# Patient Record
Sex: Male | Born: 1980 | Race: Black or African American | Hispanic: No | Marital: Married | Smoking: Current every day smoker
Health system: Southern US, Community
[De-identification: ages and names within clinical notes are randomized; demographics above are authoritative.]

## PROBLEM LIST (undated history)

## (undated) DIAGNOSIS — K56609 Unspecified intestinal obstruction, unspecified as to partial versus complete obstruction: Secondary | ICD-10-CM

## (undated) DIAGNOSIS — W3400XA Accidental discharge from unspecified firearms or gun, initial encounter: Secondary | ICD-10-CM

## (undated) DIAGNOSIS — I10 Essential (primary) hypertension: Secondary | ICD-10-CM

## (undated) HISTORY — PX: SMALL INTESTINE SURGERY: SHX150

## (undated) HISTORY — PX: ABDOMINAL SURGERY: SHX537

---

## 2015-04-06 ENCOUNTER — Emergency Department (HOSPITAL_BASED_OUTPATIENT_CLINIC_OR_DEPARTMENT_OTHER)
Admission: EM | Admit: 2015-04-06 | Discharge: 2015-04-06 | Disposition: A | Discharge status: Home / Self Care | Payer: Medicare Other | Attending: Emergency Medicine | Admitting: Emergency Medicine

## 2015-04-06 ENCOUNTER — Encounter (HOSPITAL_BASED_OUTPATIENT_CLINIC_OR_DEPARTMENT_OTHER): Payer: Self-pay

## 2015-04-06 DIAGNOSIS — L0501 Pilonidal cyst with abscess: Secondary | ICD-10-CM | POA: Diagnosis not present

## 2015-04-06 DIAGNOSIS — Z880 Allergy status to penicillin: Secondary | ICD-10-CM | POA: Diagnosis not present

## 2015-04-06 DIAGNOSIS — Z720 Tobacco use: Secondary | ICD-10-CM | POA: Insufficient documentation

## 2015-04-06 DIAGNOSIS — Z8719 Personal history of other diseases of the digestive system: Secondary | ICD-10-CM | POA: Diagnosis not present

## 2015-04-06 DIAGNOSIS — L729 Follicular cyst of the skin and subcutaneous tissue, unspecified: Secondary | ICD-10-CM | POA: Diagnosis present

## 2015-04-06 HISTORY — DX: Unspecified intestinal obstruction, unspecified as to partial versus complete obstruction: K56.609

## 2015-04-06 MED ORDER — LIDOCAINE-EPINEPHRINE 2 %-1:100000 IJ SOLN
20.0000 mL | Freq: Once | INTRAMUSCULAR | Status: AC
  Administered 2015-04-06: 20 mL via INTRADERMAL
  Filled 2015-04-06: qty 1

## 2015-04-06 MED ORDER — HYDROCODONE-ACETAMINOPHEN 5-325 MG PO TABS
2.0000 | ORAL_TABLET | Freq: Once | ORAL | Status: AC
  Administered 2015-04-06: 2 via ORAL
  Filled 2015-04-06: qty 2

## 2015-04-06 NOTE — ED Notes (Signed)
MD at bedside. 

## 2015-04-06 NOTE — Discharge Instructions (Signed)
°  Pilonidal Cyst, Care After °A pilonidal cyst occurs when hairs get trapped (ingrown) beneath the skin in the crease between the buttocks over your sacrum (the bone under that crease). Pilonidal cysts are most common in young men with a lot of body hair. When the cyst breaks(ruptured) or leaks, fluid from the cyst may cause burning and itching. If the cyst becomes infected, it causes a painful swelling filled with pus (abscess). The pus and trapped hairs need to be removed (often by lancing) so that the infection can heal. The word pilonidal means hair nest. °HOME CARE INSTRUCTIONS °If the pilonidal sinus was NOT DRAINING OR LANCED: °· Keep the area clean and dry. Bathe or shower daily. Wash the area well with a germ-killing soap. Hot tub baths may help prevent infection. Dry the area well with a towel. °· Avoid tight clothing in order to keep area as moisture-free as possible. °· Keep area between buttocks as free from hair as possible. A depilatory may be used. °· Take antibiotics as directed. °· Only take over-the-counter or prescription medicines for pain, discomfort, or fever as directed by your caregiver. °If the cyst WAS INFECTED AND NEEDED TO BE DRAINED: °· Your caregiver may have packed the wound with gauze to keep the wound open. This allows the wound to heal from the inside outward and continue to drain. °· Return as directed for a wound check. °· If you take tub baths or showers, repack the wound with gauze as directed following. Sponge baths are a good alternative. Sitz baths may be used three to four times a day or as directed. °· If an antibiotic was ordered to fight the infection, take as directed. °· Only take over-the-counter or prescription medicines for pain, discomfort, or fever as directed by your caregiver. °· If a drain was in place and removed, use sitz baths for 20 minutes 4 times per day. Clean the wound gently with mild unscented soap, pat dry, and then apply a dry dressing as  directed. °If you had surgery and IT WAS MARSUPIALIZED (LEFT OPEN): °· Your wound was packed with gauze to keep the wound open. This allows the wound to heal from the inside outwards and continue draining. The changing of the dressing regularly also helps keep the wound clean. °· Return as directed for a wound check. °· If you take tub baths or showers, repack the wound with gauze as directed following. Sponge baths are a good alternative. Sitz baths can also be used. This may be done three to four times a day or as directed. °· If an antibiotic was ordered to fight the infection, take as directed. °· Only take over-the-counter or prescription medicines for pain, discomfort, or fever as directed by your caregiver. °· If you had surgery and the wound was closed you may care for it as directed. This generally includes keeping it dry and clean and dressing it as directed. °SEEK MEDICAL CARE IF:  °· You have increased pain, swelling, redness, drainage, or bleeding from the area. °· You have a fever. °· You have muscles aches, dizziness, or a general ill feeling. °Document Released: 10/12/2006 Document Revised: 05/14/2013 Document Reviewed: 12/27/2006 °[employer]® Patient Information ©2015 [employer]. This information is not intended to replace advice given to you by your health care provider. Make sure you discuss any questions you have with your health care provider. ° ° °
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
## 2015-04-12 ENCOUNTER — Other Ambulatory Visit: Payer: Self-pay | Admitting: General Surgery

## 2015-04-12 DIAGNOSIS — L0501 Pilonidal cyst with abscess: Secondary | ICD-10-CM

## 2015-12-31 ENCOUNTER — Emergency Department (HOSPITAL_BASED_OUTPATIENT_CLINIC_OR_DEPARTMENT_OTHER)
Admission: EM | Admit: 2015-12-31 | Discharge: 2016-01-01 | Disposition: A | Discharge status: Home / Self Care | Payer: Medicare Other | Attending: Emergency Medicine | Admitting: Emergency Medicine

## 2015-12-31 ENCOUNTER — Encounter (HOSPITAL_BASED_OUTPATIENT_CLINIC_OR_DEPARTMENT_OTHER): Payer: Self-pay | Admitting: *Deleted

## 2015-12-31 DIAGNOSIS — M545 Low back pain: Secondary | ICD-10-CM | POA: Diagnosis present

## 2015-12-31 DIAGNOSIS — L0591 Pilonidal cyst without abscess: Secondary | ICD-10-CM | POA: Diagnosis not present

## 2015-12-31 DIAGNOSIS — F17200 Nicotine dependence, unspecified, uncomplicated: Secondary | ICD-10-CM | POA: Diagnosis not present

## 2015-12-31 HISTORY — DX: Accidental discharge from unspecified firearms or gun, initial encounter: W34.00XA

## 2015-12-31 LAB — CBC WITH DIFFERENTIAL/PLATELET
BASOS PCT: 0 %
Basophils Absolute: 0 10*3/uL (ref 0.0–0.1)
EOS PCT: 1 %
Eosinophils Absolute: 0.1 10*3/uL (ref 0.0–0.7)
HCT: 39.3 % (ref 39.0–52.0)
Hemoglobin: 13.5 g/dL (ref 13.0–17.0)
LYMPHS ABS: 1.8 10*3/uL (ref 0.7–4.0)
Lymphocytes Relative: 17 %
MCH: 30 pg (ref 26.0–34.0)
MCHC: 34.4 g/dL (ref 30.0–36.0)
MCV: 87.3 fL (ref 78.0–100.0)
MONO ABS: 1 10*3/uL (ref 0.1–1.0)
Monocytes Relative: 10 %
Neutro Abs: 7.5 10*3/uL (ref 1.7–7.7)
Neutrophils Relative %: 72 %
Platelets: 226 10*3/uL (ref 150–400)
RBC: 4.5 MIL/uL (ref 4.22–5.81)
RDW: 13.4 % (ref 11.5–15.5)
WBC: 10.4 10*3/uL (ref 4.0–10.5)

## 2015-12-31 MED ORDER — LIDOCAINE HCL (PF) 1 % IJ SOLN
5.0000 mL | Freq: Once | INTRAMUSCULAR | Status: DC
  Filled 2015-12-31: qty 5
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
## 2016-01-01 NOTE — ED Notes (Signed)
Placed a clean dry bandage on Pt. Buttock no drainage noted at present time.
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
## 2016-01-01 NOTE — Discharge Instructions (Signed)
Pilonidal Cyst  A pilonidal cyst is a fluid-filled sac. It forms beneath the skin near your tailbone, at the top of the crease of your buttocks. A pilonidal cyst that is not large or infected may not cause symptoms or problems.  If the cyst becomes irritated or infected, it may fill with pus. This causes pain and swelling (pilonidal abscess). An infected cyst may need to be treated with medicine, drained, or removed.  CAUSES  The cause of a pilonidal cyst is not known. One cause may be a hair that grows into your skin (ingrown hair).  RISK FACTORS  Pilonidal cysts are more common in boys and men. Risk factors include:  · Having lots of hair near the crease of the buttocks.  · Being overweight.  · Having a pilonidal dimple.  · Wearing tight clothing.  · Not bathing or showering frequently.  · Sitting for long periods of time.  SIGNS AND SYMPTOMS  Signs and symptoms of a pilonidal cyst may include:  · Redness.  · Pain and tenderness.  · Warmth.  · Swelling.  · Pus.  · Fever.  DIAGNOSIS  Your health care provider may diagnose a pilonidal cyst based on your symptoms and a physical exam. The health care provider may do a blood test to check for infection. If your cyst is draining pus, your health care provider may take a sample of the drainage to be tested at a laboratory.  TREATMENT  Surgery is the usual treatment for an infected pilonidal cyst. You may also have to take medicines before surgery. The type of surgery you have depends on the size and severity of the infected cyst. The different kinds of surgery include:  · Incision and drainage. This is a procedure to open and drain the cyst.  · Marsupialization. In this procedure, a large cyst or abscess may be opened and kept open by stitching the edges of the skin to the cyst walls.  · Cyst removal. This procedure involves opening the skin and removing all or part of the cyst.  HOME CARE INSTRUCTIONS  · Follow all of your surgeon's instructions carefully if you had  surgery.  · Take medicines only as directed by your health care provider.  · If you were prescribed an antibiotic medicine, finish it all even if you start to feel better.  · Keep the area around your pilonidal cyst clean and dry.  · Clean the area as directed by your health care provider. Pat the area dry with a clean towel. Do not rub it as this may cause bleeding.  · Remove hair from the area around the cyst as directed by your health care provider.  · Do not wear tight clothing or sit in one place for long periods of time.  · There are many different ways to close and cover an incision, including stitches, skin glue, and adhesive strips. Follow your health care provider's instructions on:    Incision care.    Bandage (dressing) changes and removal.    Incision closure removal.  SEEK MEDICAL CARE IF:   · You have drainage, redness, swelling, or pain at the site of the cyst.  · You have a fever.     This information is not intended to replace advice given to you by your health care provider. Make sure you discuss any questions you have with your health care provider.     Document Released: 09/08/2000 Document Revised: 10/02/2014 Document Reviewed: 01/29/2014  [employer] Interactive Patient   Education ©2016 [employer]  -

## 2018-01-27 ENCOUNTER — Emergency Department (HOSPITAL_COMMUNITY): Payer: Medicare Other

## 2018-01-27 ENCOUNTER — Encounter (HOSPITAL_COMMUNITY): Payer: Self-pay | Admitting: *Deleted

## 2018-01-27 ENCOUNTER — Emergency Department (HOSPITAL_COMMUNITY)
Admission: EM | Admit: 2018-01-27 | Discharge: 2018-01-27 | Disposition: A | Discharge status: Home / Self Care | Payer: Medicare Other | Attending: Emergency Medicine | Admitting: Emergency Medicine

## 2018-01-27 DIAGNOSIS — Y92410 Unspecified street and highway as the place of occurrence of the external cause: Secondary | ICD-10-CM | POA: Insufficient documentation

## 2018-01-27 DIAGNOSIS — S0231XA Fracture of orbital floor, right side, initial encounter for closed fracture: Secondary | ICD-10-CM | POA: Diagnosis not present

## 2018-01-27 DIAGNOSIS — M25551 Pain in right hip: Secondary | ICD-10-CM | POA: Insufficient documentation

## 2018-01-27 DIAGNOSIS — Y9389 Activity, other specified: Secondary | ICD-10-CM | POA: Diagnosis not present

## 2018-01-27 DIAGNOSIS — F17200 Nicotine dependence, unspecified, uncomplicated: Secondary | ICD-10-CM | POA: Insufficient documentation

## 2018-01-27 DIAGNOSIS — S0993XA Unspecified injury of face, initial encounter: Secondary | ICD-10-CM | POA: Diagnosis present

## 2018-01-27 DIAGNOSIS — Y999 Unspecified external cause status: Secondary | ICD-10-CM | POA: Diagnosis not present

## 2018-01-27 DIAGNOSIS — M25511 Pain in right shoulder: Secondary | ICD-10-CM

## 2018-01-27 DIAGNOSIS — R0789 Other chest pain: Secondary | ICD-10-CM | POA: Diagnosis not present

## 2018-01-27 LAB — CBC WITH DIFFERENTIAL/PLATELET
Basophils Absolute: 0 10*3/uL (ref 0.0–0.1)
Basophils Relative: 0 %
Eosinophils Absolute: 0.1 10*3/uL (ref 0.0–0.7)
Eosinophils Relative: 1 %
HEMATOCRIT: 40.9 % (ref 39.0–52.0)
HEMOGLOBIN: 13.9 g/dL (ref 13.0–17.0)
Lymphocytes Relative: 22 %
Lymphs Abs: 1.7 10*3/uL (ref 0.7–4.0)
MCH: 29.8 pg (ref 26.0–34.0)
MCHC: 34 g/dL (ref 30.0–36.0)
MCV: 87.6 fL (ref 78.0–100.0)
MONO ABS: 0.7 10*3/uL (ref 0.1–1.0)
MONOS PCT: 10 %
NEUTROS ABS: 4.9 10*3/uL (ref 1.7–7.7)
NEUTROS PCT: 67 %
Platelets: 213 10*3/uL (ref 150–400)
RBC: 4.67 MIL/uL (ref 4.22–5.81)
RDW: 14 % (ref 11.5–15.5)
WBC: 7.4 10*3/uL (ref 4.0–10.5)

## 2018-01-27 LAB — PROTIME-INR
INR: 1.02
Prothrombin Time: 13.3 seconds (ref 11.4–15.2)

## 2018-01-27 LAB — TYPE AND SCREEN
ABO/RH(D): B POS
Antibody Screen: NEGATIVE

## 2018-01-27 LAB — BASIC METABOLIC PANEL
ANION GAP: 12 (ref 5–15)
BUN: 11 mg/dL (ref 6–20)
CO2: 26 mmol/L (ref 22–32)
Calcium: 9.2 mg/dL (ref 8.9–10.3)
Chloride: 102 mmol/L (ref 101–111)
Creatinine, Ser: 0.85 mg/dL (ref 0.61–1.24)
GFR calc non Af Amer: >60 mL/min (ref >60)
Glucose, Bld: 107 mg/dL — ABNORMAL HIGH (ref 65–99)
Potassium: 3.3 mmol/L — ABNORMAL LOW (ref 3.5–5.1)
Sodium: 140 mmol/L (ref 135–145)

## 2018-01-27 LAB — ABO/RH: ABO/RH(D): B POS

## 2018-01-27 MED ORDER — IBUPROFEN 800 MG PO TABS: 800.0000 mg | ORAL_TABLET | Freq: Three times a day (TID) | ORAL | 0 refills | Status: AC | PRN

## 2018-01-27 MED ORDER — FLUORESCEIN SODIUM 1 MG OP STRP
1.0000 | ORAL_STRIP | Freq: Once | OPHTHALMIC | Status: AC
  Administered 2018-01-27: 1 via OPHTHALMIC
  Filled 2018-01-27: qty 1

## 2018-01-27 MED ORDER — TETRACAINE HCL 0.5 % OP SOLN
2.0000 [drp] | Freq: Once | OPHTHALMIC | Status: AC
  Administered 2018-01-27: 2 [drp] via OPHTHALMIC
  Filled 2018-01-27: qty 4

## 2018-01-27 MED ORDER — OXYCODONE-ACETAMINOPHEN 5-325 MG PO TABS: 1.0000 | ORAL_TABLET | ORAL | 0 refills | Status: AC | PRN

## 2018-01-27 MED ORDER — MORPHINE SULFATE (PF) 4 MG/ML IV SOLN
4.0000 mg | Freq: Once | INTRAVENOUS | Status: AC
  Administered 2018-01-27: 4 mg via INTRAVENOUS
  Filled 2018-01-27: qty 1

## 2018-01-27 MED ORDER — AMOXICILLIN-POT CLAVULANATE 875-125 MG PO TABS: 1.0000 | ORAL_TABLET | Freq: Two times a day (BID) | ORAL | 0 refills | Status: AC

## 2018-01-27 MED ORDER — IBUPROFEN 800 MG PO TABS
800.0000 mg | ORAL_TABLET | Freq: Once | ORAL | Status: AC
  Administered 2018-01-27: 800 mg via ORAL
  Filled 2018-01-27: qty 1
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
## 2018-01-27 NOTE — ED Notes (Signed)
Patient transported to X-ray 
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
## 2018-04-03 ENCOUNTER — Other Ambulatory Visit: Payer: Self-pay

## 2018-04-03 ENCOUNTER — Encounter (HOSPITAL_BASED_OUTPATIENT_CLINIC_OR_DEPARTMENT_OTHER): Payer: Self-pay

## 2018-04-03 ENCOUNTER — Emergency Department (HOSPITAL_BASED_OUTPATIENT_CLINIC_OR_DEPARTMENT_OTHER)
Admission: EM | Admit: 2018-04-03 | Discharge: 2018-04-03 | Disposition: A | Discharge status: Home / Self Care | Payer: Medicare Other | Attending: Emergency Medicine | Admitting: Emergency Medicine

## 2018-04-03 DIAGNOSIS — I10 Essential (primary) hypertension: Secondary | ICD-10-CM | POA: Insufficient documentation

## 2018-04-03 DIAGNOSIS — S40869A Insect bite (nonvenomous) of unspecified upper arm, initial encounter: Secondary | ICD-10-CM | POA: Insufficient documentation

## 2018-04-03 DIAGNOSIS — Y9259 Other trade areas as the place of occurrence of the external cause: Secondary | ICD-10-CM | POA: Diagnosis not present

## 2018-04-03 DIAGNOSIS — Y9389 Activity, other specified: Secondary | ICD-10-CM | POA: Insufficient documentation

## 2018-04-03 DIAGNOSIS — S80869A Insect bite (nonvenomous), unspecified lower leg, initial encounter: Secondary | ICD-10-CM | POA: Insufficient documentation

## 2018-04-03 DIAGNOSIS — Y998 Other external cause status: Secondary | ICD-10-CM | POA: Diagnosis not present

## 2018-04-03 DIAGNOSIS — F17210 Nicotine dependence, cigarettes, uncomplicated: Secondary | ICD-10-CM | POA: Insufficient documentation

## 2018-04-03 DIAGNOSIS — W57XXXA Bitten or stung by nonvenomous insect and other nonvenomous arthropods, initial encounter: Secondary | ICD-10-CM | POA: Diagnosis not present

## 2018-04-03 DIAGNOSIS — R21 Rash and other nonspecific skin eruption: Secondary | ICD-10-CM | POA: Diagnosis present

## 2018-04-03 HISTORY — DX: Essential (primary) hypertension: I10

## 2018-04-03 MED ORDER — DIPHENHYDRAMINE HCL 25 MG PO TABS: 25.0000 mg | ORAL_TABLET | ORAL | 0 refills | Status: AC | PRN

## 2018-04-03 MED ORDER — HYDROCORTISONE 1 % EX CREA: TOPICAL_CREAM | CUTANEOUS | 0 refills | Status: AC
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
## 2018-04-03 NOTE — Discharge Instructions (Addendum)
You were seen in the emergency department for a rash, suspected to be bedbugs.  We are treating supportively with Benadryl and with topical steroid cream (hydrocortisone).  Please utilize these medications as prescribed.  Wash all clothing and items in hot water.   Follow-up with primary care provider in 5 days for reevaluation.  Return to the ER anytime for new or worsening symptoms including but not limited to worsening rash, trouble breathing, feeling of throat closing, fever, or any other concerns.  Have your blood pressure rechecked by primary care provider as this was elevated in the ER today.  Vitals:   04/03/18 1223  BP: (!) 154/115  Pulse: 89  Resp: 18  Temp: 98.3 F (36.8 C)  SpO2: 100%
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
## 2019-10-27 DEATH — deceased
